# Patient Record
Sex: Male | Born: 1980 | Race: Black or African American | Hispanic: No | Smoking: Former smoker
Health system: Southern US, Community
[De-identification: ages and names within clinical notes are randomized; demographics above are authoritative.]

## PROBLEM LIST (undated history)

## (undated) DIAGNOSIS — I10 Essential (primary) hypertension: Secondary | ICD-10-CM

## (undated) HISTORY — PX: BRAIN SURGERY: SHX531

---

## 2014-03-23 ENCOUNTER — Encounter (HOSPITAL_BASED_OUTPATIENT_CLINIC_OR_DEPARTMENT_OTHER): Payer: Self-pay | Admitting: *Deleted

## 2014-03-23 ENCOUNTER — Emergency Department (HOSPITAL_BASED_OUTPATIENT_CLINIC_OR_DEPARTMENT_OTHER)
Admission: EM | Admit: 2014-03-23 | Discharge: 2014-03-24 | Disposition: A | Discharge status: Home / Self Care | Payer: Medicaid Other | Attending: Emergency Medicine | Admitting: Emergency Medicine

## 2014-03-23 DIAGNOSIS — S29012A Strain of muscle and tendon of back wall of thorax, initial encounter: Secondary | ICD-10-CM | POA: Diagnosis not present

## 2014-03-23 DIAGNOSIS — Y92410 Unspecified street and highway as the place of occurrence of the external cause: Secondary | ICD-10-CM | POA: Insufficient documentation

## 2014-03-23 DIAGNOSIS — S233XXA Sprain of ligaments of thoracic spine, initial encounter: Secondary | ICD-10-CM | POA: Diagnosis not present

## 2014-03-23 DIAGNOSIS — Y9389 Activity, other specified: Secondary | ICD-10-CM | POA: Diagnosis not present

## 2014-03-23 DIAGNOSIS — Y998 Other external cause status: Secondary | ICD-10-CM | POA: Insufficient documentation

## 2014-03-23 DIAGNOSIS — S3992XA Unspecified injury of lower back, initial encounter: Secondary | ICD-10-CM | POA: Diagnosis present
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
## 2014-03-24 ENCOUNTER — Emergency Department (HOSPITAL_BASED_OUTPATIENT_CLINIC_OR_DEPARTMENT_OTHER): Payer: Medicaid Other

## 2014-03-24 MED ORDER — HYDROCODONE-ACETAMINOPHEN 5-325 MG PO TABS: 1.0000 | ORAL_TABLET | Freq: Four times a day (QID) | ORAL | Status: DC | PRN

## 2014-03-24 NOTE — ED Notes (Signed)
Patient transported to X-ray 
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
## 2014-07-02 ENCOUNTER — Emergency Department (HOSPITAL_BASED_OUTPATIENT_CLINIC_OR_DEPARTMENT_OTHER)
Admission: EM | Admit: 2014-07-02 | Discharge: 2014-07-02 | Disposition: A | Discharge status: Home / Self Care | Payer: Medicaid Other | Attending: Emergency Medicine | Admitting: Emergency Medicine

## 2014-07-02 ENCOUNTER — Other Ambulatory Visit: Payer: Self-pay

## 2014-07-02 ENCOUNTER — Encounter (HOSPITAL_BASED_OUTPATIENT_CLINIC_OR_DEPARTMENT_OTHER): Payer: Self-pay

## 2014-07-02 DIAGNOSIS — T63441A Toxic effect of venom of bees, accidental (unintentional), initial encounter: Secondary | ICD-10-CM

## 2014-07-02 DIAGNOSIS — T782XXA Anaphylactic shock, unspecified, initial encounter: Secondary | ICD-10-CM | POA: Diagnosis not present

## 2014-07-02 DIAGNOSIS — R000 Tachycardia, unspecified: Secondary | ICD-10-CM | POA: Insufficient documentation

## 2014-07-02 MED ORDER — SODIUM CHLORIDE 0.9 % IV BOLUS (SEPSIS): 1000.0000 mL | Freq: Once | INTRAVENOUS | Status: DC

## 2014-07-02 MED ORDER — EPINEPHRINE HCL 0.1 MG/ML IJ SOSY: 0.3000 mg | PREFILLED_SYRINGE | Freq: Once | INTRAMUSCULAR | Status: DC

## 2014-07-02 MED ORDER — FAMOTIDINE IN NACL 20-0.9 MG/50ML-% IV SOLN
20.0000 mg | Freq: Once | INTRAVENOUS | Status: AC
  Administered 2014-07-02: 20 mg via INTRAVENOUS

## 2014-07-02 MED ORDER — SODIUM CHLORIDE 0.9 % IV BOLUS (SEPSIS)
1000.0000 mL | Freq: Once | INTRAVENOUS | Status: AC
  Administered 2014-07-02: 1000 mL via INTRAVENOUS

## 2014-07-02 MED ORDER — EPINEPHRINE HCL 1 MG/ML IJ SOLN
INTRAMUSCULAR | Status: AC
  Administered 2014-07-02: 0.3 mg
  Filled 2014-07-02: qty 1

## 2014-07-02 MED ORDER — FAMOTIDINE IN NACL 20-0.9 MG/50ML-% IV SOLN
INTRAVENOUS | Status: AC
  Administered 2014-07-02: 20 mg via INTRAVENOUS
  Filled 2014-07-02: qty 50

## 2014-07-02 MED ORDER — METHYLPREDNISOLONE SODIUM SUCC 125 MG IJ SOLR
INTRAMUSCULAR | Status: AC
  Administered 2014-07-02: 125 mg via INTRAVENOUS
  Filled 2014-07-02: qty 2

## 2014-07-02 MED ORDER — DIPHENHYDRAMINE HCL 50 MG/ML IJ SOLN
INTRAMUSCULAR | Status: AC
  Administered 2014-07-02: 50 mg via INTRAVENOUS
  Filled 2014-07-02: qty 1

## 2014-07-02 MED ORDER — PREDNISONE 20 MG PO TABS: 40.0000 mg | ORAL_TABLET | Freq: Every day | ORAL | Status: AC

## 2014-07-02 MED ORDER — METHYLPREDNISOLONE SODIUM SUCC 125 MG IJ SOLR
125.0000 mg | Freq: Once | INTRAMUSCULAR | Status: AC
  Administered 2014-07-02: 125 mg via INTRAVENOUS

## 2014-07-02 MED ORDER — DIPHENHYDRAMINE HCL 50 MG/ML IJ SOLN
50.0000 mg | Freq: Once | INTRAMUSCULAR | Status: AC
  Administered 2014-07-02: 50 mg via INTRAVENOUS

## 2014-07-02 MED ORDER — EPINEPHRINE 0.3 MG/0.3ML IJ SOAJ: 0.3000 mg | Freq: Once | INTRAMUSCULAR | Status: AC

## 2014-07-02 NOTE — Discharge Instructions (Signed)
Anaphylactic Reaction °An anaphylactic reaction is a sudden, severe allergic reaction that involves the whole body. It can be life threatening. A hospital stay is often required. People with asthma, eczema, or hay fever are slightly more likely to have an anaphylactic reaction. °CAUSES  °An anaphylactic reaction may be caused by anything to which you are allergic. After being exposed to the allergic substance, your immune system becomes sensitized to it. When you are exposed to that allergic substance again, an allergic reaction can occur. Common causes of an anaphylactic reaction include: °· Medicines. °· Foods, especially peanuts, wheat, shellfish, milk, and eggs. °· Insect bites or stings. °· Blood products. °· Chemicals, such as dyes, latex, and contrast material used for imaging tests. °SYMPTOMS  °When an allergic reaction occurs, the body releases histamine and other substances. These substances cause symptoms such as tightening of the airway. Symptoms often develop within seconds or minutes of exposure. Symptoms may include: °· Skin rash or hives. °· Itching. °· Chest tightness. °· Swelling of the eyes, tongue, or lips. °· Trouble breathing or swallowing. °· Lightheadedness or fainting. °· Anxiety or confusion. °· Stomach pains, vomiting, or diarrhea. °· Nasal congestion. °· A fast or irregular heartbeat (palpitations). °DIAGNOSIS  °Diagnosis is based on your history of recent exposure to allergic substances, your symptoms, and a physical exam. Your caregiver may also perform blood or urine tests to confirm the diagnosis. °TREATMENT  °Epinephrine medicine is the main treatment for an anaphylactic reaction. Other medicines that may be used for treatment include antihistamines, steroids, and albuterol. In severe cases, fluids and medicine to support blood pressure may be given through an intravenous line (IV). Even if you improve after treatment, you need to be observed to make sure your condition does not get  worse. This may require a stay in the hospital. °HOME CARE INSTRUCTIONS  °· Wear a medical alert bracelet or necklace stating your allergy. °· You and your family must learn how to use an anaphylaxis kit or give an epinephrine injection to temporarily treat an emergency allergic reaction. Always carry your epinephrine injection or anaphylaxis kit with you. This can be lifesaving if you have a severe reaction. °· Do not drive or perform tasks after treatment until the medicines used to treat your reaction have worn off, or until your caregiver says it is okay. °· If you have hives or a rash: °¨ Take medicines as directed by your caregiver. °¨ You may use an over-the-counter antihistamine (diphenhydramine) as needed. °¨ Apply cold compresses to the skin or take baths in cool water. Avoid hot baths or showers. °SEEK MEDICAL CARE IF:  °· You develop symptoms of an allergic reaction to a new substance. Symptoms may start right away or minutes later. °· You develop a rash, hives, or itching. °· You develop new symptoms. °SEEK IMMEDIATE MEDICAL CARE IF:  °· You have swelling of the mouth, difficulty breathing, or wheezing. °· You have a tight feeling in your chest or throat. °· You develop hives, swelling, or itching all over your body. °· You develop severe vomiting or diarrhea. °· You feel faint or pass out. °This is an emergency. Use your epinephrine injection or anaphylaxis kit as you have been instructed. Call your local emergency services (911 in [place] Even if you improve after the injection, you need to be examined at a hospital emergency department. °MAKE SURE YOU:  °· Understand these instructions. °· Will watch your condition. °· Will get help right away if you are not   doing well or get worse. °Document Released: 01/08/2005 Document Revised: 01/13/2013 Document Reviewed: 04/11/2011 °Exit[employer]® Patient Information ©2015 [employer]. This information is not intended to replace advice given to you by your health  care provider. Make sure you discuss any questions you have with your health care provider. °Epinephrine injection (Auto-injector) °What is this medicine? °EPINEPHRINE (ep i NEF rin) is used for the emergency treatment of severe allergic reactions. You should keep this medicine with you at all times. °This medicine may be used for other purposes; ask your health care provider or pharmacist if you have questions. °COMMON BRAND NAME(S): Adrenaclick, Auvi-Q, EpiPen, Twinject °What should I tell my health care provider before I take this medicine? °They need to know if you have any of the following conditions: °-diabetes °-heart disease °-high blood pressure °-lung or breathing disease, like asthma °-Parkinson's disease °-thyroid disease °-an unusual or allergic reaction to epinephrine, sulfites, other medicines, foods, dyes, or preservatives °-pregnant or trying to get pregnant °-breast-feeding °How should I use this medicine? °This medicine is for injection into the outer thigh. Your doctor or health care professional will instruct you on the proper use of the device during an emergency. Read all directions carefully and make sure you understand them. Do not use more often than directed. °Talk to your pediatrician regarding the use of this medicine in children. Special care may be needed. This drug is commonly used in children. A special device is available for use in children. °Overdosage: If you think you have taken too much of this medicine contact a poison control center or emergency room at once. °NOTE: This medicine is only for you. Do not share this medicine with others. °What if I miss a dose? °This does not apply. You should only use this medicine for an allergic reaction. °What may interact with this medicine? °This medicine is only used during an emergency. Significant drug interactions are not likely during emergency use. °This list may not describe all possible interactions. Give your health care provider  a list of all the medicines, herbs, non-prescription drugs, or dietary supplements you use. Also tell them if you smoke, drink alcohol, or use illegal drugs. Some items may interact with your medicine. °What should I watch for while using this medicine? °Keep this medicine ready for use in the case of a severe allergic reaction. Make sure that you have the phone number of your doctor or health care professional and local hospital ready. Remember to check the expiration date of your medicine regularly. You may need to have additional units of this medicine with you at work, school, or other places. Talk to your doctor or health care professional about your need for extra units. Some emergencies may require an additional dose. Check with your doctor or a health care professional before using an extra dose. °After use, go to the nearest hospital or call 911. Avoid physical activity. Make sure the treating health care professional knows you have received an injection of this medicine. You will receive additional instructions on what to do during and after use of this medicine before a medical emergency occurs. °What side effects may I notice from receiving this medicine? °Side effects that you should report to your doctor or health care professional as soon as possible: °-allergic reactions like skin rash, itching or hives, swelling of the face, lips, or tongue °-breathing problems °-chest pain °-flushing °-irregular or pounding heartbeat °-numbness in fingers or toes °-vomiting °Side effects that usually do not require medical   attention (report to your doctor or health care professional if they continue or are bothersome): °-anxiety or nervousness °-dizzy, drowsy °-dry mouth °-headache °-increased sweating °-nausea °-tired, weak °This list may not describe all possible side effects. Call your doctor for medical advice about side effects. You may report side effects to FDA at 1-800-FDA-1088. °Where should I keep my  medicine? °Keep out of the reach of children. °Store at room temperature between 15 and 30 degrees C (59 and 86 degrees F). Protect from light and heat. The solution should be clear in color. If the solution is discolored or contains particles it must be replaced. Throw away any unused medicine after the expiration date. Ask your doctor or pharmacist about proper disposal of the injector if it is expired or has been used. Always replace your auto-injector before it expires. °NOTE: This sheet is a summary. It may not cover all possible information. If you have questions about this medicine, talk to your doctor, pharmacist, or health care provider. °© 2015, [employer]. (2012-05-19 14:59:01) ° °

## 2014-07-02 NOTE — ED Notes (Signed)
MD at bedside. 

## 2014-07-02 NOTE — ED Provider Notes (Signed)
CSN: [id number]     Arrival date & time 07/02/14  1547 History   First MD Initiated Contact with Patient 07/02/14 1553     Chief Complaint  Patient presents with  . Allergic Reaction     (Consider location/radiation/quality/duration/timing/severity/associated sxs/prior Treatment) HPI  34 year old male presents about 10 minutes after being stung by yellow jacket. 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 The patient states he has had one similar reaction to this. He is currently complaining of facial swelling, itching, and redness along his chest and neck. The patient did pull the bee out of his skin. He denies tongue swelling or throat closing sensation. Denies shortness of breath but states this chest does feel little tight and he is now feeling like he has indigestion. Denies nausea or vomiting.  History reviewed. No pertinent past medical history. Past Surgical History  Procedure Laterality Date  . Brain surgery     No family history on file. History  Substance Use Topics  . Smoking status: Never Smoker   . Smokeless tobacco: Not on file  . Alcohol Use: No    Review of Systems  HENT: Positive for facial swelling.   Respiratory: Positive for chest tightness. Negative for shortness of breath and stridor.   Gastrointestinal: Negative for nausea and vomiting.  Skin: Positive for rash.  All other systems reviewed and are negative.     Allergies  Bee venom  Home Medications   Prior to Admission medications   Not on File   There were no vitals taken for this visit. Physical Exam  Constitutional: He is oriented to person, place, and time. He appears well-developed and well-nourished. He appears distressed.  HENT:  Head: Normocephalic and atraumatic.  Right Ear: External ear normal.  Left Ear: External ear normal.  Nose: Nose normal.  Mouth/Throat: Uvula is midline and oropharynx is clear and moist. No trismus in the jaw. No uvula swelling. No posterior  oropharyngeal edema or posterior oropharyngeal erythema.  Normal tongue and lip size  Eyes: Right eye exhibits no discharge. Left eye exhibits no discharge.  Neck: Neck supple.  Cardiovascular: Regular rhythm, normal heart sounds and intact distal pulses.  Tachycardia present.   Pulmonary/Chest: Effort normal. He has no wheezes.  Abdominal: Soft. There is no tenderness.  Musculoskeletal: He exhibits no edema.  Neurological: He is alert and oriented to person, place, and time.  Skin: Skin is warm and dry. Rash noted. Rash is urticarial. He is not diaphoretic.     Nursing note and vitals reviewed.   ED Course  Procedures (including critical care time) Labs Review Labs Reviewed - No data to display  Imaging Review No results found.   EKG Interpretation   Date/Time:  Friday July 02 2014 15:55:10 EDT Ventricular Rate:  127 PR Interval:  152 QRS Duration: 82 QT Interval:  302 QTC Calculation: 438 R Axis:   86 Text Interpretation:  Sinus tachycardia Possible Left atrial enlargement  ST \\T \ T wave abnormality, consider inferior ischemia Abnormal ECG No old  tracing to compare Confirmed by Justan Gaede  MD, Melisse Caetano ([id number]) on 07/02/2014  4:48:15 PM      CRITICAL CARE Performed by: Pricilla Loveless T   Total critical care time: 30 minutes  Critical care time was exclusive of separately billable procedures and treating other patients.  Critical care was necessary to treat or prevent imminent or life-threatening deterioration.  Critical care was time spent personally by me on the following activities: development of treatment  plan with patient and/or surrogate as well as nursing, discussions with consultants, evaluation of patient's response to treatment, examination of patient, obtaining history from patient or surrogate, ordering and performing treatments and interventions, ordering and review of laboratory studies, ordering and review of radiographic studies, pulse oximetry and  re-evaluation of patient's condition.  MDM   Final diagnoses:  Bee sting-induced anaphylaxis, accidental or unintentional, initial encounter    Patient's symptoms consistent with anaphylaxis from bee sting. Rapidly improved with epinephrine as well as adjunctive treatment such as benadryl, solumedrol and pepcid. No respiratory compromise and his rash/itching and swelling all resolved. He was observed in ED for 4 hours since epi injection with no recurrence of symptoms. I have stressed the importance of carrying an epi pen and will show how to use.     Pricilla Loveless, MD 07/03/14 [phone]

## 2014-07-02 NOTE — ED Notes (Signed)
Pt states he was stung by one bee approx 10 min PTA-noted hives-no resp distress at this time-pt anxious-is answering all ?s in full sentences

## 2014-08-11 ENCOUNTER — Encounter (HOSPITAL_BASED_OUTPATIENT_CLINIC_OR_DEPARTMENT_OTHER): Payer: Self-pay | Admitting: Emergency Medicine

## 2014-08-11 ENCOUNTER — Emergency Department (HOSPITAL_BASED_OUTPATIENT_CLINIC_OR_DEPARTMENT_OTHER): Payer: Medicaid Other

## 2014-08-11 ENCOUNTER — Emergency Department (HOSPITAL_BASED_OUTPATIENT_CLINIC_OR_DEPARTMENT_OTHER)
Admission: EM | Admit: 2014-08-11 | Discharge: 2014-08-11 | Disposition: A | Discharge status: Home / Self Care | Payer: Medicaid Other | Attending: Emergency Medicine | Admitting: Emergency Medicine

## 2014-08-11 DIAGNOSIS — M79672 Pain in left foot: Secondary | ICD-10-CM | POA: Insufficient documentation

## 2014-08-11 MED ORDER — INDOMETHACIN 25 MG PO CAPS
50.0000 mg | ORAL_CAPSULE | Freq: Once | ORAL | Status: AC
  Administered 2014-08-11: 50 mg via ORAL
  Filled 2014-08-11: qty 2

## 2014-08-11 MED ORDER — INDOMETHACIN 50 MG PO CAPS: 50.0000 mg | ORAL_CAPSULE | Freq: Two times a day (BID) | ORAL | Status: DC | PRN

## 2014-08-11 NOTE — ED Notes (Signed)
Pt states that he thinks his kids dropped the remote on his foot earlier in week and tonight after playing basketball it began to swell and develop intense pain. States he can not put pressure on foot

## 2014-08-11 NOTE — ED Provider Notes (Signed)
CSN: [id number]     Arrival date & time 08/11/14  0341 History   First MD Initiated Contact with Patient 08/11/14 0401     Chief Complaint  Patient presents with  . Foot Pain     (Consider location/radiation/quality/duration/timing/severity/associated sxs/prior Treatment) HPI Patient presents with pain over the dorsal surface of the left foot at the base of the third digit. States pain began this afternoon while playing basketball and has progressed throughout the day. No definite injury noted. Patient states he's had swelling and mild redness. He denies any constitutional symptoms. Specifically he denies fever, chills, malaise, myalgias, nausea or vomiting. No previous history of gout. History reviewed. No pertinent past medical history. Past Surgical History  Procedure Laterality Date  . Brain surgery     History reviewed. No pertinent family history. History  Substance Use Topics  . Smoking status: Never Smoker   . Smokeless tobacco: Not on file  . Alcohol Use: No    Review of Systems  Constitutional: Negative for fever, chills and fatigue.  Gastrointestinal: Negative for nausea and vomiting.  Musculoskeletal: Positive for arthralgias.  All other systems reviewed and are negative.     Allergies  Bee venom  Home Medications   Prior to Admission medications   Medication Sig Start Date End Date Taking? Authorizing Provider  EPINEPHrine 0.3 mg/0.3 mL IJ SOAJ injection Inject 0.3 mLs (0.3 mg total) into the muscle once. 07/02/14   Pricilla LovelessScott Goldston, MD  indomethacin (INDOCIN) 50 MG capsule Take 1 capsule (50 mg total) by mouth 2 (two) times daily between meals as needed for moderate pain. 08/11/14   Loren Raceravid Xylan Sheils, MD   BP 122/75 mmHg  Pulse 87  Temp(Src) 98.2 F (36.8 C) (Oral)  Resp 18  Ht 5\' 9"  (1.753 m)  Wt 265 lb (120.203 kg)  BMI 39.12 kg/m2  SpO2 100% Physical Exam  Constitutional: He is oriented to person, place, and time. He appears well-developed and  well-nourished. No distress.  HENT:  Head: Normocephalic and atraumatic.  Eyes: EOM are normal. Pupils are equal, round, and reactive to light.  Neck: Normal range of motion. Neck supple.  Cardiovascular: Normal rate and regular rhythm.   Pulmonary/Chest: Effort normal and breath sounds normal.  Abdominal: Soft.  Musculoskeletal: Normal range of motion. He exhibits tenderness. He exhibits no edema.  Patient with tenderness to palpation over the dorsal surface of the base of the third digit on the left foot. There is mild swelling and some erythema. Questionable warmth compared to the right foot. Distal pulses intact.  Neurological: He is alert and oriented to person, place, and time.  Skin: Skin is warm and dry. No rash noted. No erythema.  Psychiatric: He has a normal mood and affect. His behavior is normal.  Nursing note and vitals reviewed.   ED Course  Procedures (including critical care time) Labs Review Labs Reviewed - No data to display  Imaging Review No results found.   EKG Interpretation None      MDM   Final diagnoses:  Foot pain, left   Concern for possible gout. Much lower suspicion for infected joint. Patient is afebrile without any constitutional symptoms. We'll start on indomethacin and have the patient follow-up with his primary physician. Return precautions given.     Loren Raceravid Jahmai Finelli, MD 08/14/14 [phone]

## 2014-08-11 NOTE — Discharge Instructions (Signed)

## 2014-09-14 ENCOUNTER — Encounter (HOSPITAL_BASED_OUTPATIENT_CLINIC_OR_DEPARTMENT_OTHER): Payer: Self-pay | Admitting: Emergency Medicine

## 2014-09-14 ENCOUNTER — Emergency Department (HOSPITAL_BASED_OUTPATIENT_CLINIC_OR_DEPARTMENT_OTHER)
Admission: EM | Admit: 2014-09-14 | Discharge: 2014-09-14 | Disposition: A | Discharge status: Home / Self Care | Payer: Medicaid Other | Attending: Emergency Medicine | Admitting: Emergency Medicine

## 2014-09-14 ENCOUNTER — Emergency Department (HOSPITAL_BASED_OUTPATIENT_CLINIC_OR_DEPARTMENT_OTHER): Payer: Medicaid Other

## 2014-09-14 DIAGNOSIS — S93601A Unspecified sprain of right foot, initial encounter: Secondary | ICD-10-CM

## 2014-09-14 DIAGNOSIS — Y9367 Activity, basketball: Secondary | ICD-10-CM | POA: Diagnosis not present

## 2014-09-14 DIAGNOSIS — Y998 Other external cause status: Secondary | ICD-10-CM | POA: Insufficient documentation

## 2014-09-14 DIAGNOSIS — X58XXXA Exposure to other specified factors, initial encounter: Secondary | ICD-10-CM | POA: Insufficient documentation

## 2014-09-14 DIAGNOSIS — Y92310 Basketball court as the place of occurrence of the external cause: Secondary | ICD-10-CM | POA: Insufficient documentation

## 2014-09-14 DIAGNOSIS — S99921A Unspecified injury of right foot, initial encounter: Secondary | ICD-10-CM | POA: Diagnosis present

## 2014-09-14 NOTE — Discharge Instructions (Signed)
Follow up with your primary doctor if not improving in the next week.   Foot Sprain The muscles and cord like structures which attach muscle to bone (tendons) that surround the feet are made up of units. A foot sprain can occur at the weakest spot in any of these units. This condition is most often caused by injury to or overuse of the foot, as from playing contact sports, or aggravating a previous injury, or from poor conditioning, or obesity. SYMPTOMS  Pain with movement of the foot.  Tenderness and swelling at the injury site.  Loss of strength is present in moderate or severe sprains. THE THREE GRADES OR SEVERITY OF FOOT SPRAIN ARE:  Mild (Grade I): Slightly pulled muscle without tearing of muscle or tendon fibers or loss of strength.  Moderate (Grade II): Tearing of fibers in a muscle, tendon, or at the attachment to bone, with small decrease in strength.  Severe (Grade III): Rupture of the muscle-tendon-bone attachment, with separation of fibers. Severe sprain requires surgical repair. Often repeating (chronic) sprains are caused by overuse. Sudden (acute) sprains are caused by direct injury or over-use. DIAGNOSIS  Diagnosis of this condition is usually by your own observation. If problems continue, a caregiver may be required for further evaluation and treatment. X-rays may be required to make sure there are not breaks in the bones (fractures) present. Continued problems may require physical therapy for treatment. PREVENTION  Use strength and conditioning exercises appropriate for your sport.  Warm up properly prior to working out.  Use athletic shoes that are made for the sport you are participating in.  Allow adequate time for healing. Early return to activities makes repeat injury more likely, and can lead to an unstable arthritic foot that can result in prolonged disability. Mild sprains generally heal in 3 to 10 days, with moderate and severe sprains taking 2 to 10 weeks.  Your caregiver can help you determine the proper time required for healing. HOME CARE INSTRUCTIONS   Apply ice to the injury for 15-20 minutes, 03-04 times per day. Put the ice in a plastic bag and place a towel between the bag of ice and your skin.  An elastic wrap (like an Ace bandage) may be used to keep swelling down.  Keep foot above the level of the heart, or at least raised on a footstool, when swelling and pain are present.  Try to avoid use other than gentle range of motion while the foot is painful. Do not resume use until instructed by your caregiver. Then begin use gradually, not increasing use to the point of pain. If pain does develop, decrease use and continue the above measures, gradually increasing activities that do not cause discomfort, until you gradually achieve normal use.  Use crutches if and as instructed, and for the length of time instructed.  Keep injured foot and ankle wrapped between treatments.  Massage foot and ankle for comfort and to keep swelling down. Massage from the toes up towards the knee.  Only take over-the-counter or prescription medicines for pain, discomfort, or fever as directed by your caregiver. SEEK IMMEDIATE MEDICAL CARE IF:   Your pain and swelling increase, or pain is not controlled with medications.  You have loss of feeling in your foot or your foot turns cold or blue.  You develop new, unexplained symptoms, or an increase of the symptoms that brought you to your caregiver. MAKE SURE YOU:   Understand these instructions.  Will watch your condition.  Will  get help right away if you are not doing well or get worse. Document Released: 06/30/2001 Document Revised: 04/02/2011 Document Reviewed: 08/28/2007 [facility] Patient Information 2015 [place], [place]. This information is not intended to replace advice given to you by your health care provider. Make sure you discuss any questions you have with your health care provider.

## 2014-09-14 NOTE — ED Provider Notes (Signed)
CSN: [id number]     Arrival date & time 09/14/14  0402 History   First MD Initiated Contact with Patient 09/14/14 [phone]     Chief Complaint  Patient presents with  . Foot Pain     (Consider location/radiation/quality/duration/timing/severity/associated sxs/prior Treatment) HPI Comments: Patient is a 34 year old male who presents with complaints of right foot pain. He states that he was playing basketball in a pair of sandals when he twisted his foot. He complains of pain to the area of the distal fifth metatarsal. His pain is worse with ambulation and bearing weight.  Patient is a 34 y.o. male presenting with lower extremity pain. The history is provided by the patient.  Foot Pain This is a new problem. Episode onset: 2 weeks ago. The problem occurs constantly. The problem has not changed since onset.The symptoms are aggravated by walking. Nothing relieves the symptoms. He has tried nothing for the symptoms. The treatment provided no relief.    History reviewed. No pertinent past medical history. Past Surgical History  Procedure Laterality Date  . Brain surgery     History reviewed. No pertinent family history. Social History  Substance Use Topics  . Smoking status: Never Smoker   . Smokeless tobacco: None  . Alcohol Use: No    Review of Systems  All other systems reviewed and are negative.     Allergies  Bee venom  Home Medications   Prior to Admission medications   Medication Sig Start Date End Date Taking? Authorizing Provider  EPINEPHrine 0.3 mg/0.3 mL IJ SOAJ injection Inject 0.3 mLs (0.3 mg total) into the muscle once. 07/02/14   Pricilla Loveless, MD  indomethacin (INDOCIN) 50 MG capsule Take 1 capsule (50 mg total) by mouth 2 (two) times daily between meals as needed for moderate pain. 08/11/14   Loren Racer, MD   BP 180/84 mmHg  Pulse 88  Temp(Src) 98 F (36.7 C) (Oral)  Resp 16  Ht  (1.626 m)  Wt 260 lb (117.935 kg)  BMI 44.61 kg/m2  SpO2  98% Physical Exam  Constitutional: He is oriented to person, place, and time. He appears well-developed and well-nourished. No distress.  HENT:  Head: Normocephalic and atraumatic.  Neck: Normal range of motion. Neck supple.  Musculoskeletal:  The right foot appears grossly normal. He has tenderness to palpation to the distal fifth metatarsal. There is no obvious swelling or ecchymosis.  Neurological: He is alert and oriented to person, place, and time.  Skin: Skin is warm and dry. He is not diaphoretic.  Nursing note and vitals reviewed.   ED Course  Procedures (including critical care time) Labs Review Labs Reviewed - No data to display  Imaging Review No results found. I have personally reviewed and evaluated these images and lab results as part of my medical decision-making.   EKG Interpretation None      MDM   Final diagnoses:  None    Xrays are negative for fracture.  Will treat as a sprain.  F/U PRN.    Geoffery Lyons, MD 09/14/14 [phone]

## 2014-09-14 NOTE — ED Notes (Addendum)
Pt reports injuried foot 2 weeks ago and is having on going pain since then Pt further states that since he was bring a friend to the ED for care that he should be examined.

## 2016-09-06 ENCOUNTER — Encounter (HOSPITAL_BASED_OUTPATIENT_CLINIC_OR_DEPARTMENT_OTHER): Payer: Self-pay | Admitting: Emergency Medicine

## 2016-09-06 ENCOUNTER — Emergency Department (HOSPITAL_BASED_OUTPATIENT_CLINIC_OR_DEPARTMENT_OTHER)
Admission: EM | Admit: 2016-09-06 | Discharge: 2016-09-06 | Disposition: A | Discharge status: Home / Self Care | Payer: Medicaid Other | Attending: Emergency Medicine | Admitting: Emergency Medicine

## 2016-09-06 DIAGNOSIS — Z87891 Personal history of nicotine dependence: Secondary | ICD-10-CM | POA: Diagnosis not present

## 2016-09-06 DIAGNOSIS — I10 Essential (primary) hypertension: Secondary | ICD-10-CM | POA: Diagnosis not present

## 2016-09-06 DIAGNOSIS — M5430 Sciatica, unspecified side: Secondary | ICD-10-CM | POA: Diagnosis not present

## 2016-09-06 DIAGNOSIS — M545 Low back pain: Secondary | ICD-10-CM | POA: Diagnosis present

## 2016-09-06 HISTORY — DX: Essential (primary) hypertension: I10

## 2016-09-06 MED ORDER — NAPROXEN 375 MG PO TABS: 375.0000 mg | ORAL_TABLET | Freq: Two times a day (BID) | ORAL | 0 refills | Status: DC

## 2016-09-06 MED ORDER — METHOCARBAMOL 500 MG PO TABS
1000.0000 mg | ORAL_TABLET | Freq: Once | ORAL | Status: AC
  Administered 2016-09-06: 1000 mg via ORAL
  Filled 2016-09-06: qty 2

## 2016-09-06 MED ORDER — NAPROXEN 250 MG PO TABS
500.0000 mg | ORAL_TABLET | Freq: Once | ORAL | Status: AC
  Administered 2016-09-06: 500 mg via ORAL
  Filled 2016-09-06: qty 2

## 2016-09-06 MED ORDER — METHOCARBAMOL 500 MG PO TABS: 500.0000 mg | ORAL_TABLET | Freq: Two times a day (BID) | ORAL | 0 refills | Status: DC
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
## 2018-05-29 ENCOUNTER — Other Ambulatory Visit: Payer: Self-pay

## 2018-05-29 ENCOUNTER — Emergency Department (HOSPITAL_BASED_OUTPATIENT_CLINIC_OR_DEPARTMENT_OTHER)
Admission: EM | Admit: 2018-05-29 | Discharge: 2018-05-29 | Disposition: A | Discharge status: Home / Self Care | Payer: Medicaid Other | Attending: Emergency Medicine | Admitting: Emergency Medicine

## 2018-05-29 ENCOUNTER — Encounter (HOSPITAL_BASED_OUTPATIENT_CLINIC_OR_DEPARTMENT_OTHER): Payer: Self-pay | Admitting: Emergency Medicine

## 2018-05-29 DIAGNOSIS — F17210 Nicotine dependence, cigarettes, uncomplicated: Secondary | ICD-10-CM | POA: Insufficient documentation

## 2018-05-29 DIAGNOSIS — R030 Elevated blood-pressure reading, without diagnosis of hypertension: Secondary | ICD-10-CM | POA: Insufficient documentation

## 2018-05-29 DIAGNOSIS — Z79899 Other long term (current) drug therapy: Secondary | ICD-10-CM | POA: Diagnosis not present

## 2018-05-29 DIAGNOSIS — F1910 Other psychoactive substance abuse, uncomplicated: Secondary | ICD-10-CM | POA: Diagnosis not present

## 2018-05-29 DIAGNOSIS — E876 Hypokalemia: Secondary | ICD-10-CM | POA: Diagnosis not present

## 2018-05-29 DIAGNOSIS — F1290 Cannabis use, unspecified, uncomplicated: Secondary | ICD-10-CM | POA: Diagnosis present

## 2018-05-29 DIAGNOSIS — I10 Essential (primary) hypertension: Secondary | ICD-10-CM

## 2018-05-29 LAB — ETHANOL: Alcohol, Ethyl (B): 42 mg/dL — ABNORMAL HIGH (ref <10)

## 2018-05-29 LAB — RAPID URINE DRUG SCREEN, HOSP PERFORMED
Amphetamines: NOT DETECTED
Barbiturates: NOT DETECTED
Benzodiazepines: NOT DETECTED
Cocaine: POSITIVE — AB
Opiates: NOT DETECTED
Tetrahydrocannabinol: POSITIVE — AB

## 2018-05-29 LAB — CBC WITH DIFFERENTIAL/PLATELET
Abs Immature Granulocytes: 0.03 10*3/uL (ref 0.00–0.07)
Basophils Absolute: 0 10*3/uL (ref 0.0–0.1)
Basophils Relative: 0 %
Eosinophils Absolute: 0 10*3/uL (ref 0.0–0.5)
Eosinophils Relative: 0 %
HCT: 44.9 % (ref 39.0–52.0)
Hemoglobin: 15 g/dL (ref 13.0–17.0)
Immature Granulocytes: 0 %
Lymphocytes Relative: 25 %
Lymphs Abs: 1.9 10*3/uL (ref 0.7–4.0)
MCH: 29.2 pg (ref 26.0–34.0)
MCHC: 33.4 g/dL (ref 30.0–36.0)
MCV: 87.5 fL (ref 80.0–100.0)
Monocytes Absolute: 0.5 10*3/uL (ref 0.1–1.0)
Monocytes Relative: 6 %
Neutro Abs: 5.2 10*3/uL (ref 1.7–7.7)
Neutrophils Relative %: 69 %
Platelets: 285 10*3/uL (ref 150–400)
RBC: 5.13 MIL/uL (ref 4.22–5.81)
RDW: 12.3 % (ref 11.5–15.5)
WBC: 7.7 10*3/uL (ref 4.0–10.5)
nRBC: 0 % (ref 0.0–0.2)

## 2018-05-29 LAB — COMPREHENSIVE METABOLIC PANEL
ALT: 40 U/L (ref 0–44)
AST: 35 U/L (ref 15–41)
Albumin: 4.6 g/dL (ref 3.5–5.0)
Alkaline Phosphatase: 54 U/L (ref 38–126)
Anion gap: 18 — ABNORMAL HIGH (ref 5–15)
BUN: 8 mg/dL (ref 6–20)
CO2: 16 mmol/L — ABNORMAL LOW (ref 22–32)
Calcium: 8.8 mg/dL — ABNORMAL LOW (ref 8.9–10.3)
Chloride: 98 mmol/L (ref 98–111)
Creatinine, Ser: 1.24 mg/dL (ref 0.61–1.24)
GFR calc Af Amer: >60 mL/min (ref >60)
GFR calc non Af Amer: >60 mL/min (ref >60)
Glucose, Bld: 259 mg/dL — ABNORMAL HIGH (ref 70–99)
Potassium: 2.8 mmol/L — ABNORMAL LOW (ref 3.5–5.1)
Sodium: 132 mmol/L — ABNORMAL LOW (ref 135–145)
Total Bilirubin: 0.7 mg/dL (ref 0.3–1.2)
Total Protein: 7.9 g/dL (ref 6.5–8.1)

## 2018-05-29 LAB — MAGNESIUM: Magnesium: 1.9 mg/dL (ref 1.7–2.4)

## 2018-05-29 MED ORDER — SODIUM CHLORIDE 0.9 % IV BOLUS
1000.0000 mL | Freq: Once | INTRAVENOUS | Status: AC
  Administered 2018-05-29: 03:00:00 1000 mL via INTRAVENOUS

## 2018-05-29 MED ORDER — LABETALOL HCL 5 MG/ML IV SOLN
20.0000 mg | Freq: Once | INTRAVENOUS | Status: AC
  Administered 2018-05-29: 03:00:00 20 mg via INTRAVENOUS
  Filled 2018-05-29: qty 4

## 2018-05-29 MED ORDER — POTASSIUM CHLORIDE CRYS ER 20 MEQ PO TBCR: 20.0000 meq | EXTENDED_RELEASE_TABLET | Freq: Two times a day (BID) | ORAL | 0 refills | Status: AC

## 2018-05-29 MED ORDER — LORAZEPAM 2 MG/ML IJ SOLN
1.0000 mg | Freq: Once | INTRAMUSCULAR | Status: AC
  Administered 2018-05-29: 03:00:00 1 mg via INTRAVENOUS
  Filled 2018-05-29: qty 1

## 2018-05-29 MED ORDER — POTASSIUM CHLORIDE 10 MEQ/100ML IV SOLN
10.0000 meq | INTRAVENOUS | Status: AC
  Administered 2018-05-29 (×2): 10 meq via INTRAVENOUS
  Filled 2018-05-29 (×2): qty 100

## 2018-05-29 MED ORDER — POTASSIUM CHLORIDE CRYS ER 20 MEQ PO TBCR
40.0000 meq | EXTENDED_RELEASE_TABLET | Freq: Once | ORAL | Status: AC
  Administered 2018-05-29: 40 meq via ORAL
  Filled 2018-05-29: qty 2

## 2018-05-29 MED ORDER — SODIUM CHLORIDE 0.9 % IV SOLN
INTRAVENOUS | Status: DC | PRN
  Administered 2018-05-29: 04:00:00 via INTRAVENOUS

## 2018-05-29 NOTE — Discharge Instructions (Signed)
DO NOT USE DRUGS!!! 

## 2018-05-29 NOTE — ED Notes (Signed)
ED Provider at bedside. 

## 2018-05-29 NOTE — ED Provider Notes (Signed)
[facility] EMERGENCY DEPARTMENT Provider Note   CSN: [id number] Arrival date & time: 05/29/18  0219    History   Chief Complaint Chief Complaint  Patient presents with  . detox    HPI Marcus Glenn. is a 38 y.o. male.     The history is provided by the patient.  He has history of hypertension and comes in complaining of not feeling better after using drugs tonight.  He admits to having drank 5 or 6 beers, used a lot of marijuana and used about a gram of cocaine.  He states that he feels like he is sinking.  However, he is mainly very evasive about what does not feel right.  He denies hurting anywhere and denies any dyspnea.  He denies nausea or vomiting.  Past Medical History:  Diagnosis Date  . Hypertension     There are no active problems to display for this patient.   Past Surgical History:  Procedure Laterality Date  . BRAIN SURGERY          Home Medications    Prior to Admission medications   Medication Sig Start Date End Date Taking? Authorizing Provider  EPINEPHrine 0.3 mg/0.3 mL IJ SOAJ injection Inject 0.3 mLs (0.3 mg total) into the muscle once. 07/02/14   Pricilla Loveless, MD  indomethacin (INDOCIN) 50 MG capsule Take 1 capsule (50 mg total) by mouth 2 (two) times daily between meals as needed for moderate pain. 08/11/14   Loren Racer, MD  methocarbamol (ROBAXIN) 500 MG tablet Take 1 tablet (500 mg total) by mouth 2 (two) times daily. 09/06/16   Palumbo, April, MD  naproxen (NAPROSYN) 375 MG tablet Take 1 tablet (375 mg total) by mouth 2 (two) times daily. 09/06/16   Palumbo, April, MD    Family History Family History  Problem Relation Age of Onset  . CAD Other     Social History Social History   Tobacco Use  . Smoking status: Current Some Day Smoker    Types: Cigarettes  . Smokeless tobacco: Former Engineer, water Use Topics  . Alcohol use: Yes    Comment: 1-2 beers/day   . Drug use: Yes    Types: Cocaine, Marijuana      Allergies   Bee venom   Review of Systems Review of Systems  All other systems reviewed and are negative.    Physical Exam Updated Vital Signs BP (!) 183/77 (BP Location: Left Arm)   Pulse (!) 154   Temp 98.6 F (37 C) (Oral)   Resp (!) 22   Ht  (1.753 m)   Wt 117.9 kg   SpO2 100%   BMI 38.40 kg/m   Physical Exam Vitals signs and nursing note reviewed.    38 year old male, resting comfortably and in no acute distress. Vital signs are significant for elevated blood pressure and heart rate and mildly elevated respiratory rate. Oxygen saturation is 100%, which is normal. Head is normocephalic and atraumatic. PERRLA, EOMI. Oropharynx is clear. Neck is nontender and supple without adenopathy or JVD. Back is nontender and there is no CVA tenderness. Lungs are clear without rales, wheezes, or rhonchi. Chest is nontender. Heart is tachycardic without murmur. Abdomen is soft, flat, nontender without masses or hepatosplenomegaly and peristalsis is normoactive. Extremities have no cyanosis or edema, full range of motion is present. Skin is warm and dry without rash. Neurologic: Appears somewhat anxious but mental status is otherwise normal, cranial nerves are intact, there are no  motor or sensory deficits.  ED Treatments / Results  Labs (all labs ordered are listed, but only abnormal results are displayed) Labs Reviewed  ETHANOL - Abnormal; Notable for the following components:      Result Value   Alcohol, Ethyl (B) 42 (*)    All other components within normal limits  COMPREHENSIVE METABOLIC PANEL - Abnormal; Notable for the following components:   Sodium 132 (*)    Potassium 2.8 (*)    CO2 16 (*)    Glucose, Bld 259 (*)    Calcium 8.8 (*)    Anion gap 18 (*)    All other components within normal limits  RAPID URINE DRUG SCREEN, HOSP PERFORMED - Abnormal; Notable for the following components:   Cocaine POSITIVE (*)    Tetrahydrocannabinol POSITIVE (*)    All other  components within normal limits  CBC WITH DIFFERENTIAL/PLATELET  MAGNESIUM    EKG EKG Interpretation  Date/Time:  Thursday May 29 2018 03:02:56 EDT Ventricular Rate:  106 PR Interval:    QRS Duration: 101 QT Interval:  341 QTC Calculation: 453 R Axis:   81 Text Interpretation:  Sinus tachycardia Probable left atrial enlargement Nonspecific repol abnormality, inferior leads Borderline ST elevation, anterior leads When compared with ECG of 07/02/2014, No significant change was found Confirmed by Dione BoozeGlick, Emrik Erhard ([id number]) on 05/29/2018 3:04:36 AM   Procedures Procedures CRITICAL CARE Performed by: Dione Boozeavid Indyah Saulnier Total critical care time: 35 minutes Critical care time was exclusive of separately billable procedures and treating other patients. Critical care was necessary to treat or prevent imminent or life-threatening deterioration. Critical care was time spent personally by me on the following activities: development of treatment plan with patient and/or surrogate as well as nursing, discussions with consultants, evaluation of patient's response to treatment, examination of patient, obtaining history from patient or surrogate, ordering and performing treatments and interventions, ordering and review of laboratory studies, ordering and review of radiographic studies, pulse oximetry and re-evaluation of patient's condition.)  Medications Ordered in ED Medications  potassium chloride 10 mEq in 100 mL IVPB (10 mEq Intravenous New Bag/Given 05/29/18 0527)  0.9 %  sodium chloride infusion ( Intravenous Restarted 05/29/18 0524)  sodium chloride 0.9 % bolus 1,000 mL ( Intravenous Stopped 05/29/18 0359)  labetalol (NORMODYNE) injection 20 mg (20 mg Intravenous Given 05/29/18 0256)  LORazepam (ATIVAN) injection 1 mg (1 mg Intravenous Given 05/29/18 0255)  potassium chloride SA (K-DUR) CR tablet 40 mEq (40 mEq Oral Given 05/29/18 0404)     Initial Impression / Assessment and Plan / ED Course  I have reviewed the  triage vital signs and the nursing notes.  Pertinent labs & imaging results that were available during my care of the patient were reviewed by me and considered in my medical decision making (see chart for details).  Polysubstance abuse.  Tachycardia and elevated blood pressure likely related to cocaine use.  Old records are reviewed, and he has no relevant past visits.  Will check screening labs and give IV fluids and labetalol.  Also he is given a dose of lorazepam.  Blood pressure and heart rate have come down, and he is feeling much better.  Labs are significant for hypokalemia.  He is given intravenous and oral potassium.  He will be discharged with prescription for potassium.  Final Clinical Impressions(s) / ED Diagnoses   Final diagnoses:  Polysubstance abuse (HCC)  Hypokalemia  Elevated blood pressure reading with diagnosis of hypertension    ED Discharge Orders  Ordered    potassium chloride SA (K-DUR) 20 MEQ tablet  2 times daily     05/29/18 0523           Dione Booze, MD 05/29/18 [phone]

## 2018-05-29 NOTE — ED Triage Notes (Signed)
Pt states he has been partying too much  Pt states he has been drinking and smoking weed   Pt states last drank tonight Last drug use was tonight as well, smoked week and did some cocaine  Pt states he feels weak  Pt states he drinks daily

## 2019-08-31 ENCOUNTER — Emergency Department (HOSPITAL_BASED_OUTPATIENT_CLINIC_OR_DEPARTMENT_OTHER)
Admission: EM | Admit: 2019-08-31 | Discharge: 2019-08-31 | Disposition: A | Discharge status: Home / Self Care | Payer: Medicaid Other | Attending: Emergency Medicine | Admitting: Emergency Medicine

## 2019-08-31 ENCOUNTER — Encounter (HOSPITAL_BASED_OUTPATIENT_CLINIC_OR_DEPARTMENT_OTHER): Payer: Self-pay | Admitting: Emergency Medicine

## 2019-08-31 ENCOUNTER — Other Ambulatory Visit: Payer: Self-pay

## 2019-08-31 DIAGNOSIS — Z23 Encounter for immunization: Secondary | ICD-10-CM | POA: Insufficient documentation

## 2019-08-31 DIAGNOSIS — Y9389 Activity, other specified: Secondary | ICD-10-CM | POA: Diagnosis not present

## 2019-08-31 DIAGNOSIS — I10 Essential (primary) hypertension: Secondary | ICD-10-CM | POA: Diagnosis not present

## 2019-08-31 DIAGNOSIS — S51812A Laceration without foreign body of left forearm, initial encounter: Secondary | ICD-10-CM | POA: Diagnosis not present

## 2019-08-31 DIAGNOSIS — S40922A Unspecified superficial injury of left upper arm, initial encounter: Secondary | ICD-10-CM | POA: Diagnosis present

## 2019-08-31 DIAGNOSIS — W228XXA Striking against or struck by other objects, initial encounter: Secondary | ICD-10-CM | POA: Diagnosis not present

## 2019-08-31 DIAGNOSIS — Z87891 Personal history of nicotine dependence: Secondary | ICD-10-CM | POA: Diagnosis not present

## 2019-08-31 DIAGNOSIS — Y998 Other external cause status: Secondary | ICD-10-CM | POA: Diagnosis not present

## 2019-08-31 DIAGNOSIS — Y9289 Other specified places as the place of occurrence of the external cause: Secondary | ICD-10-CM | POA: Diagnosis not present

## 2019-08-31 MED ORDER — TETANUS-DIPHTH-ACELL PERTUSSIS 5-2.5-18.5 LF-MCG/0.5 IM SUSP: 0.5000 mL | Freq: Once | INTRAMUSCULAR | Status: DC

## 2019-08-31 MED ORDER — TETANUS-DIPHTH-ACELL PERTUSSIS 5-2.5-18.5 LF-MCG/0.5 IM SUSP
0.5000 mL | Freq: Once | INTRAMUSCULAR | Status: AC
  Administered 2019-08-31: 0.5 mL via INTRAMUSCULAR
  Filled 2019-08-31: qty 0.5

## 2019-08-31 NOTE — Discharge Instructions (Addendum)
You were seen today for laceration.  It appears that you have a deep abrasion where some skin is missing.  This will be allowed to heal by secondary intention.  Keep moist with antibiotic ointment and a nonadherent dressing.
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
## 2019-08-31 NOTE — ED Provider Notes (Signed)
[facility] EMERGENCY DEPARTMENT Provider Note   CSN: [id number] Arrival date & time: 08/31/19  3664     History Chief Complaint  Patient presents with   Laceration    Chai Routh. is a 39 y.o. male.  HPI     This a 39 year old male with a history of hypertension who presents with left arm injury.  Patient reports that he pushed on a glass door to forcefully and the glass broke.  He sustained a laceration/abrasion to the left arm.  He is right-handed.  Unknown last tetanus shot.  Denies other injury.  Denies significant pain.  Injury occurred approximately 4 hours ago.  Past Medical History:  Diagnosis Date   Hypertension     There are no problems to display for this patient.   Past Surgical History:  Procedure Laterality Date   BRAIN SURGERY         Family History  Problem Relation Age of Onset   CAD Other     Social History   Tobacco Use   Smoking status: Current Some Day Smoker    Types: Cigarettes   Smokeless tobacco: Former Forensic psychologist Use: Never used  Substance Use Topics   Alcohol use: Yes    Comment: 1-2 beers/day    Drug use: Yes    Types: Cocaine, Marijuana    Home Medications Prior to Admission medications   Medication Sig Start Date End Date Taking? Authorizing Provider  EPINEPHrine 0.3 mg/0.3 mL IJ SOAJ injection Inject 0.3 mLs (0.3 mg total) into the muscle once. 07/02/14   Pricilla Loveless, MD  potassium chloride SA (K-DUR) 20 MEQ tablet Take 1 tablet (20 mEq total) by mouth 2 (two) times daily. 05/29/18   Dione Booze, MD    Allergies    Bee venom  Review of Systems   Review of Systems  Skin: Positive for wound.  Neurological: Negative for weakness and numbness.  All other systems reviewed and are negative.   Physical Exam Updated Vital Signs BP (!) 158/99 (BP Location: Right Arm)    Pulse 98    Resp 20    Ht 1.753 m (5\' 9" )    Wt 122.5 kg    SpO2 98%    BMI 39.87 kg/m   Physical  Exam Vitals and nursing note reviewed.  Constitutional:      Appearance: He is well-developed. He is obese. He is not ill-appearing.  HENT:     Head: Normocephalic and atraumatic.     Mouth/Throat:     Mouth: Mucous membranes are moist.  Cardiovascular:     Rate and Rhythm: Normal rate and regular rhythm.  Pulmonary:     Effort: Pulmonary effort is normal. No respiratory distress.  Musculoskeletal:     Cervical back: Neck supple.     Comments: No obvious deformity of the left forearm  Skin:    General: Skin is warm and dry.     Comments: 2 x 4 cm tissue defect of the left forearm, deep abrasion involving epidermis, no palpable foreign body, multiple superficial abrasions  Neurological:     Mental Status: He is alert and oriented to person, place, and time.  Psychiatric:        Mood and Affect: Mood normal.     ED Results / Procedures / Treatments   Labs (all labs ordered are listed, but only abnormal results are displayed) Labs Reviewed - No data to display  EKG None  Radiology No results  found.  Procedures Procedures (including critical care time)  Medications Ordered in ED Medications  Tdap (BOOSTRIX) injection 0.5 mL (has no administration in time range)  Tdap (BOOSTRIX) injection 0.5 mL (0.5 mLs Intramuscular Given 08/31/19 5400)    ED Course  I have reviewed the triage vital signs and the nursing notes.  Pertinent labs & imaging results that were available during my care of the patient were reviewed by me and considered in my medical decision making (see chart for details).    MDM Rules/Calculators/A&P                           Patient presents with injury to the left arm.  He is overall nontoxic vital signs are reassuring.  No obvious deformities.  He has a 2 x 4 cm tissue defect which appears more as a deep abrasion with missing epidermis versus an actual laceration.  Edges do not appear to approximate as 1 edges jagged and the other is fairly straight.  I  discussed options with the patient's including healing by secondary intention versus wound repair with debridement of the edges and attempting to repair is a laceration although will likely be under some tension given missing tissue.  Either way, feel this will heal well and is in place without significant cosmetic implications.  Wound was washed out.  Plan to heal by secondary intention.  Discussed wound care with patient.  Monitor for signs and symptoms of infection.  After history, exam, and medical workup I feel the patient has been appropriately medically screened and is safe for discharge home. Pertinent diagnoses were discussed with the patient. Patient was given return precautions.   Final Clinical Impression(s) / ED Diagnoses Final diagnoses:  Laceration of left forearm, initial encounter    Rx / DC Orders ED Discharge Orders    None       Harleigh Civello, Mayer Masker, MD 08/31/19 [phone]

## 2021-01-12 ENCOUNTER — Emergency Department (HOSPITAL_BASED_OUTPATIENT_CLINIC_OR_DEPARTMENT_OTHER): Payer: No Typology Code available for payment source

## 2021-01-12 ENCOUNTER — Emergency Department (HOSPITAL_BASED_OUTPATIENT_CLINIC_OR_DEPARTMENT_OTHER)
Admission: EM | Admit: 2021-01-12 | Discharge: 2021-01-12 | Disposition: A | Discharge status: Home / Self Care | Payer: No Typology Code available for payment source | Attending: Emergency Medicine | Admitting: Emergency Medicine

## 2021-01-12 ENCOUNTER — Other Ambulatory Visit: Payer: Self-pay

## 2021-01-12 ENCOUNTER — Encounter (HOSPITAL_BASED_OUTPATIENT_CLINIC_OR_DEPARTMENT_OTHER): Payer: Self-pay | Admitting: *Deleted

## 2021-01-12 DIAGNOSIS — Y92410 Unspecified street and highway as the place of occurrence of the external cause: Secondary | ICD-10-CM | POA: Insufficient documentation

## 2021-01-12 DIAGNOSIS — G8911 Acute pain due to trauma: Secondary | ICD-10-CM | POA: Insufficient documentation

## 2021-01-12 DIAGNOSIS — Z79899 Other long term (current) drug therapy: Secondary | ICD-10-CM | POA: Insufficient documentation

## 2021-01-12 DIAGNOSIS — R519 Headache, unspecified: Secondary | ICD-10-CM | POA: Diagnosis not present

## 2021-01-12 DIAGNOSIS — I10 Essential (primary) hypertension: Secondary | ICD-10-CM | POA: Diagnosis not present

## 2021-01-12 DIAGNOSIS — Z87891 Personal history of nicotine dependence: Secondary | ICD-10-CM | POA: Diagnosis not present

## 2021-01-12 DIAGNOSIS — S199XXA Unspecified injury of neck, initial encounter: Secondary | ICD-10-CM | POA: Diagnosis present

## 2021-01-12 DIAGNOSIS — S161XXA Strain of muscle, fascia and tendon at neck level, initial encounter: Secondary | ICD-10-CM | POA: Diagnosis not present

## 2021-01-12 LAB — BASIC METABOLIC PANEL
Anion gap: 9 (ref 5–15)
BUN: 13 mg/dL (ref 6–20)
CO2: 24 mmol/L (ref 22–32)
Calcium: 8.8 mg/dL — ABNORMAL LOW (ref 8.9–10.3)
Chloride: 102 mmol/L (ref 98–111)
Creatinine, Ser: 0.9 mg/dL (ref 0.61–1.24)
GFR, Estimated: >60 mL/min (ref >60)
Glucose, Bld: 88 mg/dL (ref 70–99)
Potassium: 3.3 mmol/L — ABNORMAL LOW (ref 3.5–5.1)
Sodium: 135 mmol/L (ref 135–145)

## 2021-01-12 MED ORDER — LOSARTAN POTASSIUM 25 MG PO TABS
25.0000 mg | ORAL_TABLET | Freq: Once | ORAL | Status: AC
  Administered 2021-01-12: 21:00:00 25 mg via ORAL
  Filled 2021-01-12: qty 1

## 2021-01-12 MED ORDER — LOSARTAN POTASSIUM 25 MG PO TABS: 25.0000 mg | ORAL_TABLET | Freq: Every day | ORAL | 0 refills | Status: AC
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
## 2021-01-12 NOTE — Discharge Instructions (Addendum)
I am giving you a 1 month prescription for losartan.  Please begin taking this once per day for your blood pressure.  Please follow-up with your regular doctor to have your blood pressure as well as your medication regimen reassessed.  If you develop any new or worsening symptoms please come back to the emergency department.
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
